# Patient Record
Sex: Male | Born: 1990 | Marital: Single | Smoking: Current every day smoker
Health system: Southern US, Community
[De-identification: ages and names within clinical notes are randomized; demographics above are authoritative.]

## PROBLEM LIST (undated history)

## (undated) HISTORY — PX: APPENDECTOMY: SHX54

---
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
## 2012-01-27 IMAGING — US US PELVIS LIMITED
1 series · 17 of 25 positions shown · non-contrast
Comparison: none

REASON FOR EXAM: testicular pain
COMMENTS:

[Series 1: us pelvis limited · 17 of 47 slices shown]
[im 1/47]
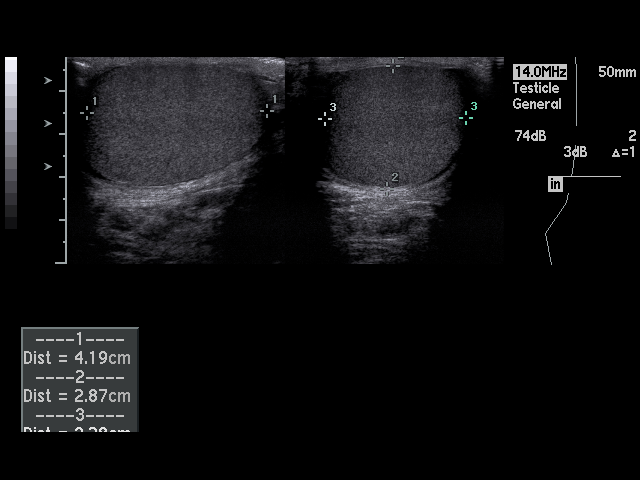
[im 4/47]
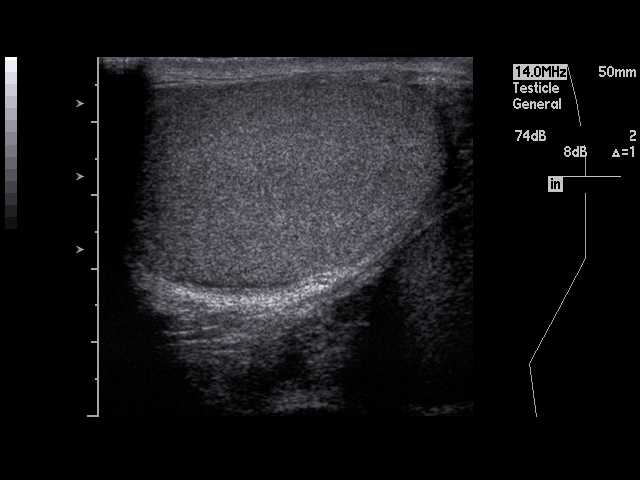
[im 6/47]
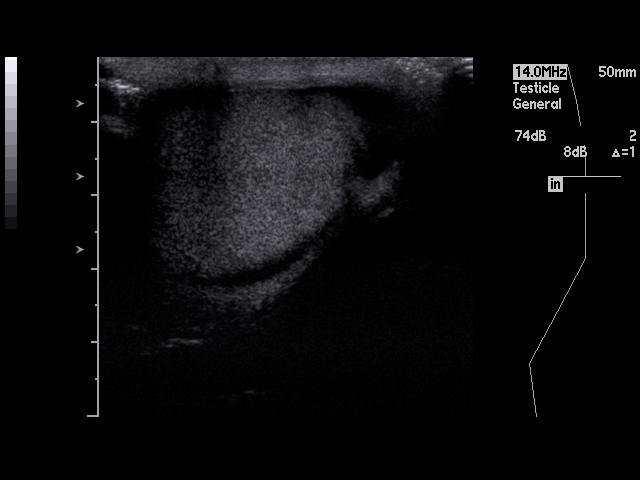
[im 10/47]
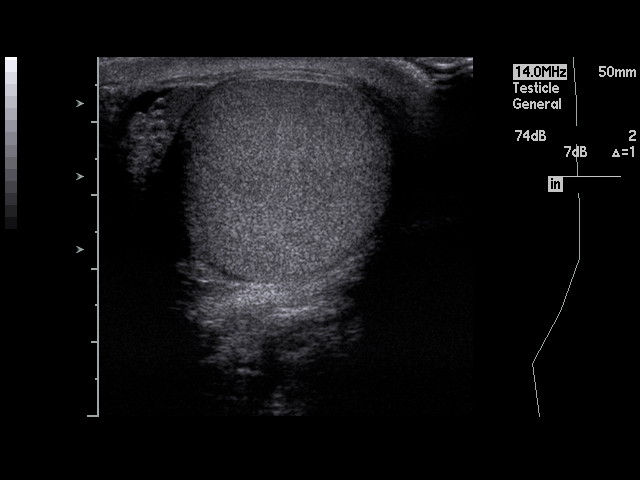
[im 12/47]
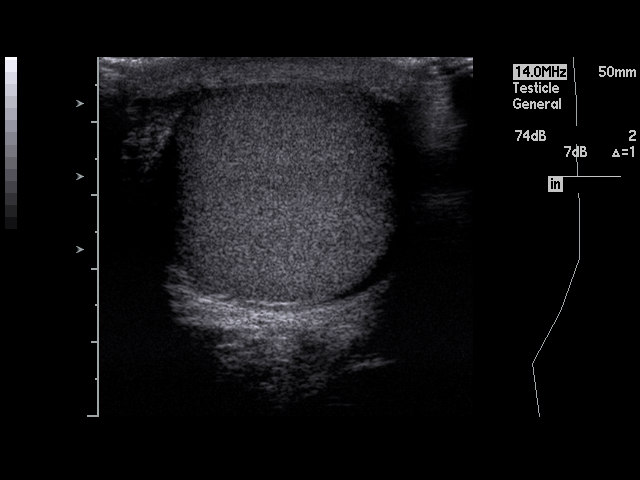
[im 16/47]
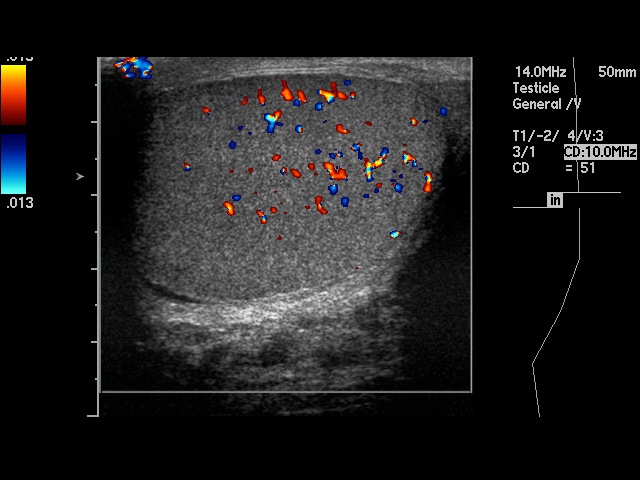
[im 18/47]
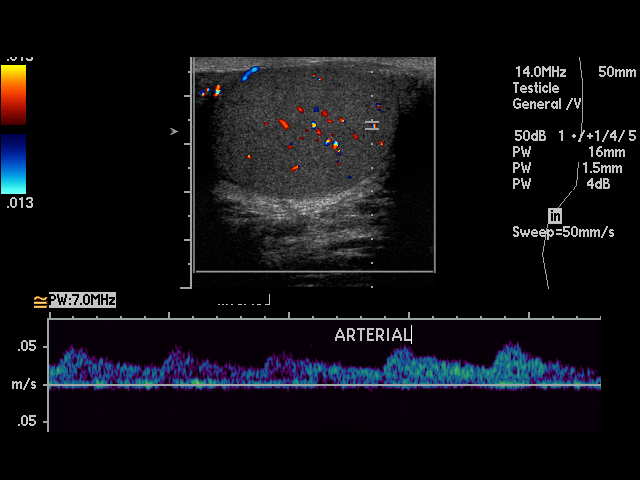
[im 22/47]
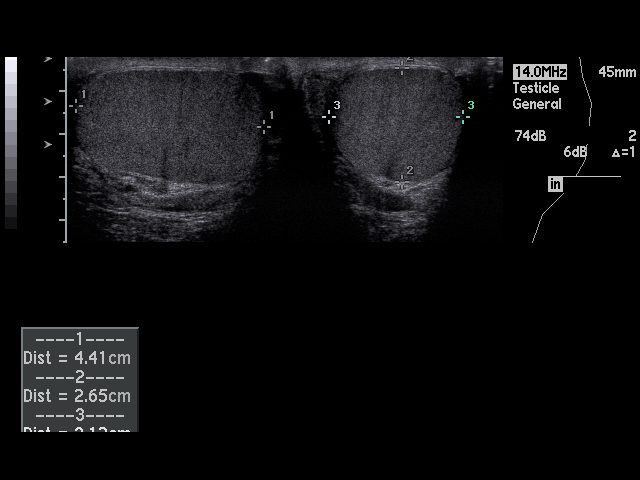
[im 24/47]
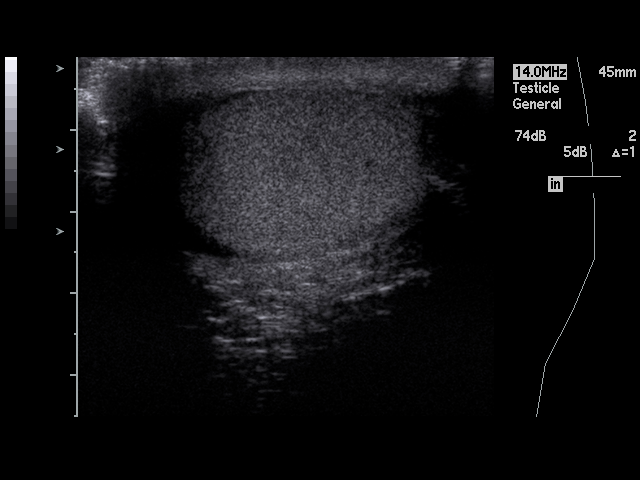
[im 25/47]
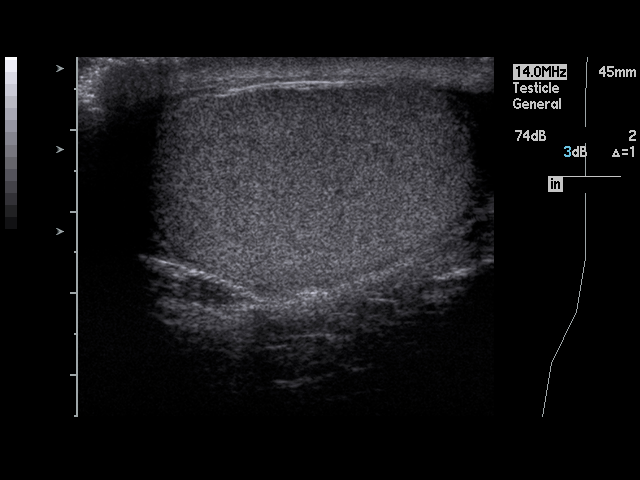
[im 29/47]
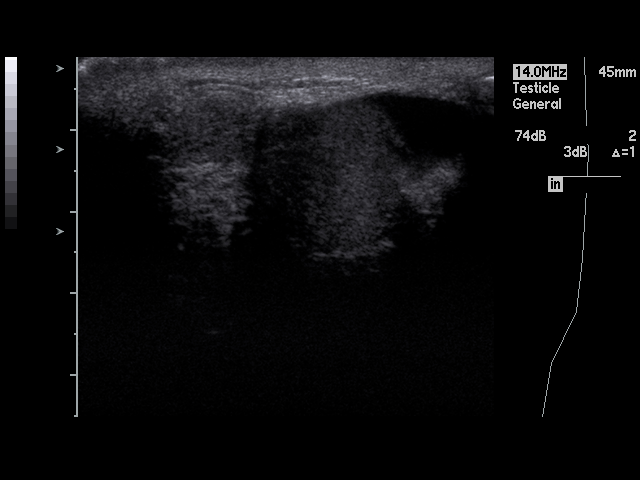
[im 31/47]
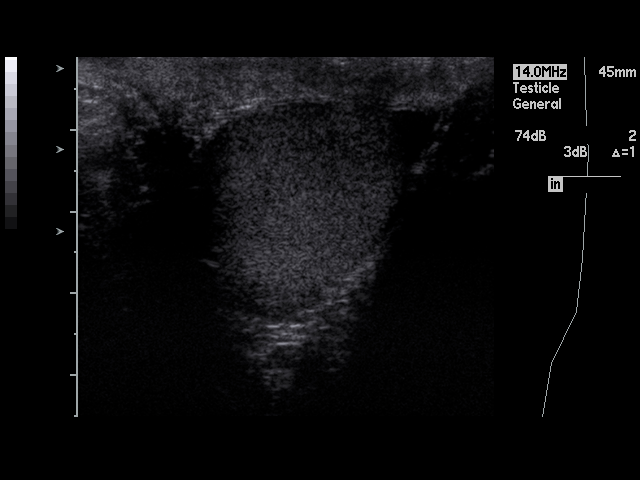
[im 35/47]
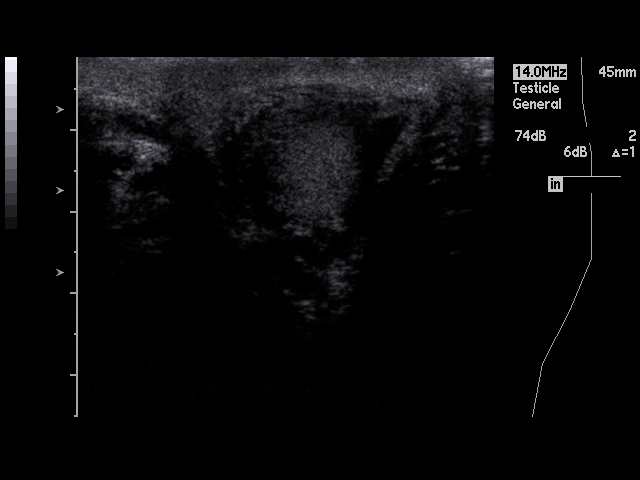
[im 37/47]
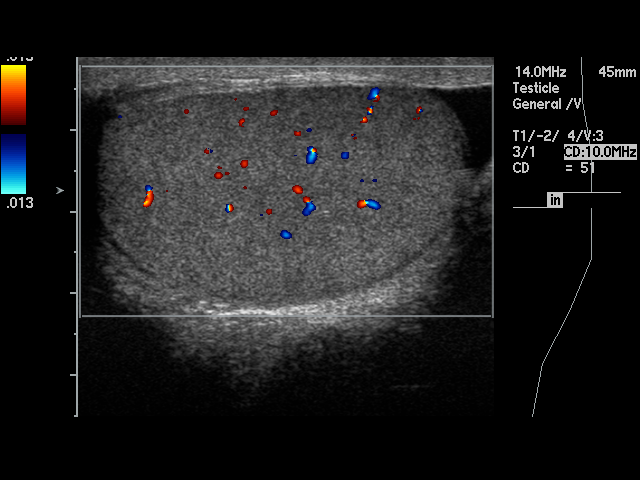
[im 41/47]
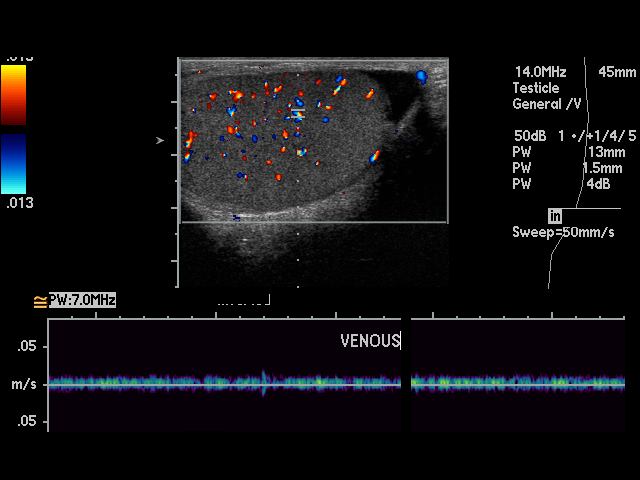
[im 43/47]
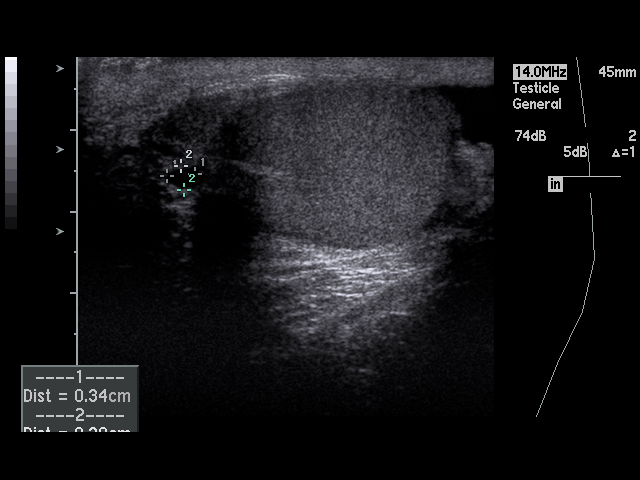
[im 47/47]
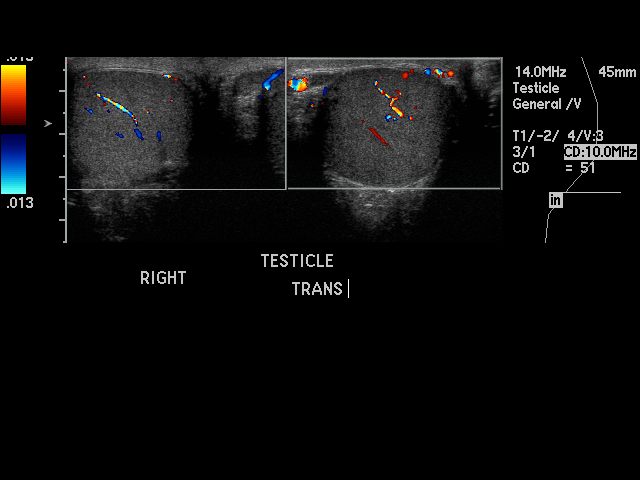

[17 of 25 positions shown; findings below may reference images not displayed]

PROCEDURE:     US  - US TESTICULAR  - March 23, 2011 [DATE]

RESULT:     The right testicle measures 4.2 cm x 2.9 cm x 3.3 cm and the
left testicle 4.4 cm x 2.7 cm x 3.1 cm. No intratesticular mass is seen.
Vascular flow is observed in each testicle on Doppler examination. No
torsion is evident. There is incidentally noted a 3.4 mm cyst of the head of
the left epididymis. No hypervascularity of the epididymis is seen on either
side. No scrotal wall thickening is seen.
IMPRESSION: No significant abnormalities are identified.

## 2019-01-03 ENCOUNTER — Telehealth: Payer: Self-pay | Admitting: Family Medicine

## 2019-01-03 NOTE — Telephone Encounter (Signed)
Still unable to reach pt by phone. 

## 2019-01-03 NOTE — Telephone Encounter (Signed)
Patient was a previous patient of yours in 2012. He would like to re establish care with you. He stated he would like to be seen as soon as possible to discuss his blood sugars.  Are you okay with patient re establishing with you ?

## 2019-01-03 NOTE — Telephone Encounter (Signed)
30 min OV when possible. Please triage in the meantime with ER cautions if he as severely elevated sugars.  Thanks.

## 2019-01-03 NOTE — Telephone Encounter (Signed)
Pt had message on phone that pt could not take the call now and to leave a v/m. I left v/m requesting pt to cb ASAP about BS. I will send message to [facility] CMA and myself.

## 2019-01-04 NOTE — Telephone Encounter (Signed)
Pt called back since did not get cb from yesterday; I advised pt I had tried to reach him back and pt informed me I did not have current phone #. I updated phone # and spoke with pt. Pt has never tested his BS; 2 ED drs told pt 3 - 4 years ago that he could have low BS. When pt has lightheadedness,H/A and feels like going to pass out if he drinks OJ or eats something he feels better. Last episode of these symptoms was 01/03/19 at 9:45 AM. Pt ate around 8:30 yesterday 2 sausage egg & cheese muffins. Lunch pt had brown rice, chicken, broccoli and diced potates. For supper a chicken and spinach salad. The symptoms have worsened over the last year. Pt has gained 70 lbs in last 1 1/2 years. Pt has not had any symptoms today.No covid symptoms except pt get SOB with anxiety when feels like going to pass out. No travel and no exposure to positive covid. Pt scheduled in office 30' reestablish care appt on 01/05/19 at 10:15. Pt advised could eat breakfast at 6 AM and be fasting or if pt feels like BS is dropping to go ahead and drink OJ or eat something. Ed precautions given. Pt voiced understanding and will update remainder of his demographics at check in. FYI to Dr Damita Dunnings.

## 2019-01-04 NOTE — Telephone Encounter (Signed)
Left v/m for pt to cb. 

## 2019-01-05 ENCOUNTER — Other Ambulatory Visit: Payer: Self-pay

## 2019-01-05 ENCOUNTER — Ambulatory Visit (INDEPENDENT_AMBULATORY_CARE_PROVIDER_SITE_OTHER): Payer: No Typology Code available for payment source | Admitting: Family Medicine

## 2019-01-05 ENCOUNTER — Encounter: Payer: Self-pay | Admitting: Family Medicine

## 2019-01-05 VITALS — BP 128/86 | HR 92 | Temp 97.1°F | Resp 18 | Ht 70.0 in | Wt 260.5 lb

## 2019-01-05 DIAGNOSIS — Z720 Tobacco use: Secondary | ICD-10-CM | POA: Diagnosis not present

## 2019-01-05 DIAGNOSIS — Z7189 Other specified counseling: Secondary | ICD-10-CM | POA: Diagnosis not present

## 2019-01-05 DIAGNOSIS — E162 Hypoglycemia, unspecified: Secondary | ICD-10-CM

## 2019-01-05 DIAGNOSIS — R635 Abnormal weight gain: Secondary | ICD-10-CM | POA: Diagnosis not present

## 2019-01-05 LAB — COMPREHENSIVE METABOLIC PANEL
ALT: 71 U/L — ABNORMAL HIGH (ref 0–53)
AST: 29 U/L (ref 0–37)
Albumin: 4.8 g/dL (ref 3.5–5.2)
Alkaline Phosphatase: 73 U/L (ref 39–117)
BUN: 17 mg/dL (ref 6–23)
CO2: 28 mEq/L (ref 19–32)
Calcium: 9.7 mg/dL (ref 8.4–10.5)
Chloride: 102 mEq/L (ref 96–112)
Creatinine, Ser: 0.83 mg/dL (ref 0.40–1.50)
GFR: 112 mL/min (ref >60.00)
Glucose, Bld: 96 mg/dL (ref 70–99)
Potassium: 4.3 mEq/L (ref 3.5–5.1)
Sodium: 138 mEq/L (ref 135–145)
Total Bilirubin: 0.5 mg/dL (ref 0.2–1.2)
Total Protein: 7.3 g/dL (ref 6.0–8.3)

## 2019-01-05 LAB — CBC WITH DIFFERENTIAL/PLATELET
Basophils Absolute: 0 10*3/uL (ref 0.0–0.1)
Basophils Relative: 0.5 % (ref 0.0–3.0)
Eosinophils Absolute: 0.2 10*3/uL (ref 0.0–0.7)
Eosinophils Relative: 2.7 % (ref 0.0–5.0)
HCT: 47.6 % (ref 39.0–52.0)
Hemoglobin: 15.7 g/dL (ref 13.0–17.0)
Lymphocytes Relative: 34.3 % (ref 12.0–46.0)
Lymphs Abs: 2.7 10*3/uL (ref 0.7–4.0)
MCHC: 33.1 g/dL (ref 30.0–36.0)
MCV: 87.7 fl (ref 78.0–100.0)
Monocytes Absolute: 0.8 10*3/uL (ref 0.1–1.0)
Monocytes Relative: 9.8 % (ref 3.0–12.0)
Neutro Abs: 4.2 10*3/uL (ref 1.4–7.7)
Neutrophils Relative %: 52.7 % (ref 43.0–77.0)
Platelets: 294 10*3/uL (ref 150.0–400.0)
RBC: 5.42 Mil/uL (ref 4.22–5.81)
RDW: 13.7 % (ref 11.5–15.5)
WBC: 8 10*3/uL (ref 4.0–10.5)

## 2019-01-05 LAB — HEMOGLOBIN A1C: Hgb A1c MFr Bld: 5.3 % (ref 4.6–6.5)

## 2019-01-05 LAB — TSH: TSH: 1.35 u[IU]/mL (ref 0.35–4.50)

## 2019-01-05 LAB — POCT CBG (FASTING - GLUCOSE)-MANUAL ENTRY: Glucose Fasting, POC: 88 mg/dL (ref 70–99)

## 2019-01-05 NOTE — Telephone Encounter (Signed)
We will see at office visit.  Thanks.

## 2019-01-05 NOTE — Progress Notes (Signed)
To reestablish care.  He is trying to stop smoking.  Smoking 1/2 ppd.  He is tapering down.  Gum use d/w pt.    Advance directive d/w pt.  He'll consider and update me.    Random episodes with feeling of presumed low sugar, going on for months to years, but more often recently.  Sx historically clearly better after taking a snack.   One episode of syncope years ago, also one episode of syncope with urination.  No episodes in years.    He has been trying to work on diet, with regular intake of 3 meals a day.  He is working on getting more healthy foods in the meantime.    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  He was prev at 185 lbs.    PMH and SH reviewed  ROS: Per HPI unless specifically indicated in ROS section   Meds, vitals, and allergies reviewed.   GEN: nad, alert and oriented HEENT: mucous membranes moist NECK: supple w/o LA CV: rrr.  no murmur PULM: ctab, no inc wob ABD: soft, +bs EXT: no edema SKIN: no acute rash  He felt a little lightheaded at the visit when sitting down, better with a snack.  Sugar 88 at the time.    Not lightheaded on standing on recheck.  See after visit summary. >30 minutes spent in face to face time with patient, >50% spent in counselling or coordination of care

## 2019-01-05 NOTE — Patient Instructions (Signed)
We'll contact you with your lab report.  Keep a snack nearby and avoid prolonged fasting.  Take care.  Glad to see you.

## 2019-01-07 ENCOUNTER — Other Ambulatory Visit: Payer: Self-pay | Admitting: Family Medicine

## 2019-01-07 ENCOUNTER — Encounter: Payer: Self-pay | Admitting: Family Medicine

## 2019-01-07 DIAGNOSIS — Z720 Tobacco use: Secondary | ICD-10-CM | POA: Insufficient documentation

## 2019-01-07 DIAGNOSIS — E162 Hypoglycemia, unspecified: Secondary | ICD-10-CM | POA: Insufficient documentation

## 2019-01-07 DIAGNOSIS — Z7189 Other specified counseling: Secondary | ICD-10-CM | POA: Insufficient documentation

## 2019-01-07 DIAGNOSIS — R7989 Other specified abnormal findings of blood chemistry: Secondary | ICD-10-CM

## 2019-01-07 DIAGNOSIS — R635 Abnormal weight gain: Secondary | ICD-10-CM | POA: Insufficient documentation

## 2019-01-07 NOTE — Assessment & Plan Note (Signed)
Presumed hypoglycemia or relative hypoglycemia.  Discussed with patient.  He had an episode at the office visit while sitting down.  He felt better with taking a snack.  He was not lightheaded on standing.  He does have weight gain noted.  See notes on labs.  He was not syncopal at the visit.

## 2019-01-07 NOTE — Assessment & Plan Note (Signed)
Discussed with patient.  He will consider whom he would want to speak for him in case he were incapacitated.

## 2019-01-07 NOTE — Assessment & Plan Note (Signed)
He is trying to stop smoking.  Smoking 1/2 ppd.  He is tapering down.  Gum use d/w pt. he will consider and update me as needed.

## 2019-01-07 NOTE — Assessment & Plan Note (Signed)
Noted with change in diet.  Discussed with patient.  See notes on labs.  Discussed diet and exercise.

## 2019-01-08 ENCOUNTER — Telehealth: Payer: Self-pay | Admitting: Family Medicine

## 2019-01-08 NOTE — Telephone Encounter (Signed)
Called patient and got him scheduled for RUQ [place] tomorrow at [place] . He needs a Dr's note clearing him to go back to work he said. Call patient at [phone] when and if this note can be done for him.

## 2019-01-08 NOTE — Telephone Encounter (Signed)
Note written and signed and left at front desk for pickup.  Patient advised.

## 2019-01-08 NOTE — Telephone Encounter (Signed)
Please give him a return note for work.  He should be able to return at this point.  Thanks.

## 2019-01-09 ENCOUNTER — Inpatient Hospital Stay: Admission: RE | Admit: 2019-01-09 | Payer: No Typology Code available for payment source | Source: Ambulatory Visit

## 2019-01-09 ENCOUNTER — Telehealth: Payer: Self-pay

## 2019-01-09 NOTE — Telephone Encounter (Signed)
Tried to call patient but no answer. Called [facility] and cancelled the [place].

## 2019-01-09 NOTE — Telephone Encounter (Signed)
Jonathan Kelly [facility] said pt does not need new order and she will contact pt. Thank you.

## 2019-01-09 NOTE — Telephone Encounter (Signed)
[facility] Night - Client Nonclinical Telephone Record AccessNurse Client [facility] Night - Client Client Site [facility] [place] Physician Renford Dills - MD Contact Type Call Who Is Calling Patient / Member / Family / Caregiver Caller Name [place] Phone Number [phone] Patient Name Jonathan Kelly Patient DOB January 23, 1991 Call Type Message Only Information Provided Reason for Call Request for General Office Information Initial Comment Caller has an appt for liver scan tomorrow at 7:40 am. He needs to change the time because he needs to fast and is hypoglycemic so he will need someone to take him and does not have a ride tomorrow. Additional Comment Call Closed By: Kathlynn Grate Transaction Date/Time: 01/08/2019 5:37:03 PM (ET)

## 2019-01-19 ENCOUNTER — Ambulatory Visit
Admission: RE | Admit: 2019-01-19 | Discharge: 2019-01-19 | Disposition: A | Discharge status: Home / Self Care | Payer: No Typology Code available for payment source | Source: Ambulatory Visit | Attending: Family Medicine | Admitting: Family Medicine

## 2019-01-19 DIAGNOSIS — R7989 Other specified abnormal findings of blood chemistry: Secondary | ICD-10-CM

## 2019-01-21 ENCOUNTER — Other Ambulatory Visit: Payer: Self-pay | Admitting: Family Medicine

## 2019-01-21 ENCOUNTER — Encounter: Payer: Self-pay | Admitting: Family Medicine

## 2019-01-21 DIAGNOSIS — K760 Fatty (change of) liver, not elsewhere classified: Secondary | ICD-10-CM | POA: Insufficient documentation

## 2019-01-23 ENCOUNTER — Telehealth: Payer: Self-pay | Admitting: Family Medicine

## 2019-01-23 NOTE — Telephone Encounter (Signed)
error 

## 2019-02-23 ENCOUNTER — Ambulatory Visit: Payer: Self-pay | Admitting: Family Medicine

## 2019-02-23 DIAGNOSIS — Z0289 Encounter for other administrative examinations: Secondary | ICD-10-CM

## 2019-04-09 ENCOUNTER — Telehealth: Payer: Self-pay

## 2019-04-09 NOTE — Telephone Encounter (Signed)
Pt said one wk ago pt started with pressure feeling in lower lt abd and lower back on lt side; no pain in back or abd. Also pt has swelling in both ankles that is worse on lt side; no redness or pain in ankles or lower legs. Pt sits a lot during the day and feet are hanging down. When pt puts feet up overnight the swelling does go down.No burning or pain or frequency of urine. No known injury.pt did have some weakness(not severe) in legs for 1-2 hours on and off on 04/08/19 but that is the first and last time of lower extremity weakness. For a few months on and off pt has SOB in mornings that only last few seconds. Pt attributed to smoking. Pt has SOB while sitting does not have to have exertion to have SOB. No CP,h/a,dizziness no arm or upper extremity weakness and no vision changes.pt had diarrhea x 1 last wk after eating barbeque pizza and pt attributed the diarrhea due to what he ate.No chills,fever, cough,ST,muscle pain or loss of taste or smell. T 98. Pt sweats a lot randomly and easily for 6 months. Pt reestablished care 01/05/19. Pt scheduled virtual visit on 04/10/19 at 8:15 and will have wt and temp ready for call from [place]. ED & UC precautions given and pt voiced understanding. FYI to Dr Damita Dunnings.

## 2019-04-10 ENCOUNTER — Other Ambulatory Visit: Payer: Self-pay

## 2019-04-10 ENCOUNTER — Encounter: Payer: Self-pay | Admitting: Family Medicine

## 2019-04-10 ENCOUNTER — Ambulatory Visit (INDEPENDENT_AMBULATORY_CARE_PROVIDER_SITE_OTHER): Payer: Self-pay | Admitting: Family Medicine

## 2019-04-10 ENCOUNTER — Ambulatory Visit (INDEPENDENT_AMBULATORY_CARE_PROVIDER_SITE_OTHER)
Admission: RE | Admit: 2019-04-10 | Discharge: 2019-04-10 | Disposition: A | Discharge status: Home / Self Care | Payer: Self-pay | Source: Ambulatory Visit | Attending: Family Medicine | Admitting: Family Medicine

## 2019-04-10 ENCOUNTER — Other Ambulatory Visit (INDEPENDENT_AMBULATORY_CARE_PROVIDER_SITE_OTHER): Payer: Self-pay

## 2019-04-10 DIAGNOSIS — K760 Fatty (change of) liver, not elsewhere classified: Secondary | ICD-10-CM

## 2019-04-10 DIAGNOSIS — R0602 Shortness of breath: Secondary | ICD-10-CM

## 2019-04-10 DIAGNOSIS — R945 Abnormal results of liver function studies: Secondary | ICD-10-CM

## 2019-04-10 DIAGNOSIS — R7989 Other specified abnormal findings of blood chemistry: Secondary | ICD-10-CM

## 2019-04-10 LAB — COMPREHENSIVE METABOLIC PANEL
ALT: 74 U/L — ABNORMAL HIGH (ref 0–53)
AST: 26 U/L (ref 0–37)
Albumin: 4.6 g/dL (ref 3.5–5.2)
Alkaline Phosphatase: 81 U/L (ref 39–117)
BUN: 10 mg/dL (ref 6–23)
CO2: 27 mEq/L (ref 19–32)
Calcium: 9.8 mg/dL (ref 8.4–10.5)
Chloride: 103 mEq/L (ref 96–112)
Creatinine, Ser: 0.89 mg/dL (ref 0.40–1.50)
GFR: 103.12 mL/min (ref >60.00)
Glucose, Bld: 113 mg/dL — ABNORMAL HIGH (ref 70–99)
Potassium: 3.8 mEq/L (ref 3.5–5.1)
Sodium: 139 mEq/L (ref 135–145)
Total Bilirubin: 0.5 mg/dL (ref 0.2–1.2)
Total Protein: 7.1 g/dL (ref 6.0–8.3)

## 2019-04-10 LAB — HEPATIC FUNCTION PANEL
ALT: 74 U/L — ABNORMAL HIGH (ref 0–53)
AST: 26 U/L (ref 0–37)
Albumin: 4.6 g/dL (ref 3.5–5.2)
Alkaline Phosphatase: 81 U/L (ref 39–117)
Bilirubin, Direct: 0.1 mg/dL (ref 0.0–0.3)
Total Bilirubin: 0.5 mg/dL (ref 0.2–1.2)
Total Protein: 7.1 g/dL (ref 6.0–8.3)

## 2019-04-10 LAB — BRAIN NATRIURETIC PEPTIDE: Pro B Natriuretic peptide (BNP): 15 pg/mL (ref 0.0–100.0)

## 2019-04-10 LAB — CBC WITH DIFFERENTIAL/PLATELET
Basophils Absolute: 0 10*3/uL (ref 0.0–0.1)
Basophils Relative: 0.5 % (ref 0.0–3.0)
Eosinophils Absolute: 0.3 10*3/uL (ref 0.0–0.7)
Eosinophils Relative: 3.7 % (ref 0.0–5.0)
HCT: 45 % (ref 39.0–52.0)
Hemoglobin: 15.2 g/dL (ref 13.0–17.0)
Lymphocytes Relative: 35.2 % (ref 12.0–46.0)
Lymphs Abs: 3.2 10*3/uL (ref 0.7–4.0)
MCHC: 33.8 g/dL (ref 30.0–36.0)
MCV: 86.9 fl (ref 78.0–100.0)
Monocytes Absolute: 0.8 10*3/uL (ref 0.1–1.0)
Monocytes Relative: 8.7 % (ref 3.0–12.0)
Neutro Abs: 4.7 10*3/uL (ref 1.4–7.7)
Neutrophils Relative %: 51.9 % (ref 43.0–77.0)
Platelets: 292 10*3/uL (ref 150.0–400.0)
RBC: 5.17 Mil/uL (ref 4.22–5.81)
RDW: 13.7 % (ref 11.5–15.5)
WBC: 9.1 10*3/uL (ref 4.0–10.5)

## 2019-04-10 NOTE — Progress Notes (Signed)
Interactive audio and video telecommunications were attempted between this provider and patient, however failed, due to patient having technical difficulties OR patient did not have access to video capability.  We continued and completed visit with audio only.   Virtual Visit via Telephone Note  I connected with patient on 04/10/19  at 8:31 AM  by telephone and verified that I am speaking with the correct person using two identifiers.  Location of patient: home.   Location of MD: [facility] Name of referring provider (if blank then none associated): Names per persons and role in encounter:  MD: Earlyne Iba, Patient: name listed above.    I discussed the limitations, risks, security and privacy concerns of performing an evaluation and management service by telephone and the availability of in person appointments. I also discussed with the patient that there may be a patient responsible charge related to this service. The patient expressed understanding and agreed to proceed.  CC: edema.   History of Present Illness:   Sweating episodically. More in the last ~5 months.   "I've always sweated easily."  Some BLE edema may get some better with elevation.  This has been going on for about 1 week.   Some lower abd pressure, intermittent. Not painful but uncomfortable.  Noted recently.    No fevers, no vomiting.  GI upset after pizza recently, this is better in the meantime.  No blood in stool.  Normal stools o/w.  No burning with urination.    Prev u/s with diffuse increase in liver echogenicity, a finding indicative of hepatic steatosis. No focal liver lesions are evident. Prev LFT elevation noted.   Weight is up slightly in the meantime, 276 lbs today, up from 260 lbs in 12/2018.  Minimal SOB.  Smoker, trying to quit.  No wheeze.    Observations/Objective: nad Speech wnl   Assessment and Plan: He has episodic sweating along with some increase in bilateral lower extremity edema and  some lower abdominal pressure that is intermittent.  Unclear significance of those symptoms.  He has a history of known fatty liver disease and mild LFT elevation.  At this point reasonable to check follow-up labs, see orders.  Check chest x-ray.  Patient will come in for those studies and we will go from there.  At this point still okay for outpatient follow-up.  He agrees.  Follow Up Instructions: See above  I discussed the assessment and treatment plan with the patient. The patient was provided an opportunity to ask questions and all were answered. The patient agreed with the plan and demonstrated an understanding of the instructions.   The patient was advised to call back or seek an in-person evaluation if the symptoms worsen or if the condition fails to improve as anticipated.  I provided 15 minutes of non-face-to-face time during this encounter.  Elsie Stain, MD

## 2019-04-10 NOTE — Telephone Encounter (Signed)
Noted. Thanks.

## 2019-04-11 ENCOUNTER — Encounter: Payer: Self-pay | Admitting: Family Medicine

## 2019-04-11 NOTE — Assessment & Plan Note (Signed)
He has episodic sweating along with some increase in bilateral lower extremity edema and some lower abdominal pressure that is intermittent.  Unclear significance of those symptoms.  He has a history of known fatty liver disease and mild LFT elevation.  At this point reasonable to check follow-up labs, see orders.  Check chest x-ray.  Patient will come in for those studies and we will go from there.  At this point still okay for outpatient follow-up.  He agrees.

## 2019-04-17 ENCOUNTER — Ambulatory Visit (INDEPENDENT_AMBULATORY_CARE_PROVIDER_SITE_OTHER): Payer: Self-pay | Admitting: Family Medicine

## 2019-04-17 ENCOUNTER — Encounter: Payer: Self-pay | Admitting: Family Medicine

## 2019-04-17 ENCOUNTER — Other Ambulatory Visit: Payer: Self-pay

## 2019-04-17 VITALS — BP 142/102 | HR 114 | Temp 97.0°F | Ht 70.0 in | Wt 280.5 lb

## 2019-04-17 DIAGNOSIS — B079 Viral wart, unspecified: Secondary | ICD-10-CM

## 2019-04-17 DIAGNOSIS — B360 Pityriasis versicolor: Secondary | ICD-10-CM

## 2019-04-17 DIAGNOSIS — K760 Fatty (change of) liver, not elsewhere classified: Secondary | ICD-10-CM

## 2019-04-17 DIAGNOSIS — R7989 Other specified abnormal findings of blood chemistry: Secondary | ICD-10-CM

## 2019-04-17 MED ORDER — SELENIUM SULFIDE 2.5 % EX LOTN: 1.0000 "application " | TOPICAL_LOTION | Freq: Every day | CUTANEOUS | 12 refills | Status: DC | PRN

## 2019-04-17 NOTE — Patient Instructions (Signed)
Cover the warts as needed.   Should blister then heal.   Use selenium as needed.   Go to the lab on the way out.  We'll contact you with your lab report. Take care.  Glad to see you.

## 2019-04-17 NOTE — Progress Notes (Signed)
BLE edema.  Some better in the last few days.  Drinking more water in the last few days.  Noted less edema in the morning after sleeping overnight.  He cut out some sugary foods in the meantime.   History of abd pressure.  Mild, LLQ.  Some better in the last few days.  No FCNAVD  Fatty liver changes d/w pt.    Episodic sweats.  Better in the last few days, for unclear reason.    He was checked for HBV and HCV with red cross donation, about 1.5 years ago.  This was before his most recent LFT elevation was noted. No high risk behavior.    Elevated pulse noted on intake but recheck pulse 96.  He is applying for jobs now.  Discussed  Meds, vitals, and allergies reviewed.   ROS: Per HPI unless specifically indicated in ROS section   nad ncat Neck supple, no LA rrr ctab No edema abd not ttp.  No rebound.  Normal bowel sounds Skin well perfused. Tinea versicolor noted on the back. He has a wart in each of the antecubital fossas bilaterally, he wanted treatment.

## 2019-04-18 LAB — HEPATITIS PANEL, ACUTE
Hep A IgM: NONREACTIVE
Hep B C IgM: NONREACTIVE
Hepatitis B Surface Ag: NONREACTIVE
Hepatitis C Ab: NONREACTIVE
SIGNAL TO CUT-OFF: 0.01 (ref <1.00)

## 2019-04-19 ENCOUNTER — Telehealth: Payer: Self-pay

## 2019-04-19 DIAGNOSIS — B079 Viral wart, unspecified: Secondary | ICD-10-CM | POA: Insufficient documentation

## 2019-04-19 DIAGNOSIS — B360 Pityriasis versicolor: Secondary | ICD-10-CM | POA: Insufficient documentation

## 2019-04-19 NOTE — Assessment & Plan Note (Signed)
This all could be from fatty liver changes.  His abdominal pressure is better than last few days.  His episodic sweating is better.  He does not have any edema now and he has a benign abdominal exam.  Reasonable to check acute hepatitis panel but otherwise continue work on diet and exercise and weight reduction.  Avoid sugary foods given the fatty liver changes.  Discussed.  See notes on labs.

## 2019-04-19 NOTE — Assessment & Plan Note (Signed)
1 in the antecubital fossa on each elbow bilaterally.  Frozen x3 with liquid nitrogen without complication after getting verbal informed consent.  Routine post nitrogen instructions given to patient and understood.  No treatment is 100% effective with one course but he may get good results with treatment today.

## 2019-04-19 NOTE — Assessment & Plan Note (Signed)
See LFT elevation discussion.

## 2019-04-19 NOTE — Telephone Encounter (Signed)
Spoke with pt in regards to lab results and pt has noticed that the swelling went down in his ankles.  Pt said that he is having some sugar withdraws as he notices a difference when he intakes sugar, which has been discussed during last visit.  Pt also explains that he sweats a lot more easily then usually.  I informed pt that I would let Dr. Damita Dunnings know.

## 2019-04-19 NOTE — Assessment & Plan Note (Signed)
Can use selenium sulfide and update me as needed.  Discussed.

## 2019-04-20 NOTE — Telephone Encounter (Signed)
I called patient & he stated he was feeling better w/o swelling. He is still watching his sugar intake & will continue this. He will let us know if he needs anything.

## 2019-04-20 NOTE — Telephone Encounter (Signed)
I would work on consistently decreasing his sugar intake and see if his symptoms improve.  I think this the best option at this point.  If is not getting any better then please let me know.  Thanks.

## 2019-06-22 ENCOUNTER — Telehealth: Payer: Self-pay

## 2019-06-22 ENCOUNTER — Other Ambulatory Visit: Payer: Self-pay

## 2019-06-22 ENCOUNTER — Ambulatory Visit (INDEPENDENT_AMBULATORY_CARE_PROVIDER_SITE_OTHER): Payer: Self-pay | Admitting: Family Medicine

## 2019-06-22 ENCOUNTER — Encounter: Payer: Self-pay | Admitting: Family Medicine

## 2019-06-22 DIAGNOSIS — B360 Pityriasis versicolor: Secondary | ICD-10-CM

## 2019-06-22 DIAGNOSIS — R109 Unspecified abdominal pain: Secondary | ICD-10-CM

## 2019-06-22 DIAGNOSIS — Z720 Tobacco use: Secondary | ICD-10-CM

## 2019-06-22 MED ORDER — SELENIUM SULFIDE 2.5 % EX LOTN: 1.0000 "application " | TOPICAL_LOTION | Freq: Every day | CUTANEOUS | 12 refills | Status: DC | PRN

## 2019-06-22 MED ORDER — FAMOTIDINE 20 MG PO TABS: 20.0000 mg | ORAL_TABLET | Freq: Two times a day (BID) | ORAL | 0 refills | Status: DC

## 2019-06-22 NOTE — Progress Notes (Signed)
This visit occurred during the SARS-CoV-2 public health emergency.  Safety protocols were in place, including screening questions prior to the visit, additional usage of staff PPE, and extensive cleaning of exam room while observing appropriate contact time as indicated for disinfecting solutions.  1PPD smoking, wants to quit.  Discussed options.  Nicotine gum may be entirely reasonable.  Discussed use.  Needed refill on selenium sulfide, used with relief of rash.  Nearly resolved.    LUQ abdominal pressure, intermittent.  Noted in the last few days.  He had felt better prior to the last few days, when he cut out sugar.  His anxiety had been better.  Ankle swelling resolved with low carb diet. No fevers. No vomiting.  No diarrhea.  No blood in stool.  No R sided abd pain.  No LLQ abd pain.  Not taking ibuprofen or aleve.  Some occ heartburn, some last night.  He had some acidic regurgitation.  Prev took prilosec in the distant past.  Unclear if TUMS helped recently.    Meds, vitals, and allergies reviewed.   ROS: Per HPI unless specifically indicated in ROS section   nad ncat Neck supple, no LA Mildly tachy but regular.  ctab Benign appearing tinea versicolor noted on the lower back. Abdomen soft, not tender.  Normal bowel sounds.  Abdominal wall not tender on testing with sit ups/rotated crunches.  No mass noted.

## 2019-06-22 NOTE — Telephone Encounter (Signed)
Please run this by triage, sounds like in office would be reasonable. Thanks.   Brigitte Pulse    .    Message text   Kerin Salen, MD 1 hour ago (10:53 AM)  [facility] Good morning,   Would you like him to be scheduled in office or virtually?   Thanks,  Raquel Sarna   Message text   Jonathan Kelly  Patient Appointment Schedule Request Pool 18 hours ago (5:25 PM)  CB Appointment Request From: Jonathan Kelly  With Provider: Elsie Stain, MD [facility] [place]  Preferred Date Range: 06/25/2019 - 06/26/2019  Preferred Times: Any Time  Reason for visit: Request an Appointment  Comments: I have been feeling great for awhile now, still haven't lost the weird but I'm suddenly having pressure in the left side of my stomach again for the past few days but it's in a different spot than before. it's more the upper left side of my stomach.

## 2019-06-22 NOTE — Patient Instructions (Signed)
Check your pulse at rest at home and update me if persistently >100. Try taking pepcid daily and let me know if that doesn't help.  Try flavored nicotine gum, see if that works for you.  Update me as needed.  Take care.  Glad to see you.

## 2019-06-22 NOTE — Telephone Encounter (Signed)
I spoke with pt and for 4 days pt has had lt upper abd pressure; no pain; Pt has no covid symptoms, no travel and no known exposure to + covid. UC & ED precautions given and pt voiced understanding. Pt scheduled in office appt with Dr Damita Dunnings today at [place].

## 2019-06-22 NOTE — Telephone Encounter (Signed)
Noted. Thanks.

## 2019-06-24 DIAGNOSIS — R109 Unspecified abdominal pain: Secondary | ICD-10-CM | POA: Insufficient documentation

## 2019-06-24 NOTE — Assessment & Plan Note (Signed)
Continue topical selenium.  Update me as needed.

## 2019-06-24 NOTE — Assessment & Plan Note (Signed)
He can check his pulse at rest at home and update me if persistently >100.  I think this is a separate issue and may resolve once he is out of the medical clinic.  Abdominal pressure could be due to GERD.  He is noted some heartburn.  He can try taking pepcid daily and let me know if that doesn't help.  Benign abdominal exam.  Okay for outpatient follow-up.  He agrees with plan.

## 2019-06-24 NOTE — Assessment & Plan Note (Signed)
He can try flavored nicotine gum, update me as needed.

## 2019-09-16 ENCOUNTER — Encounter: Payer: Self-pay | Admitting: Family Medicine

## 2019-10-05 ENCOUNTER — Encounter: Payer: Self-pay | Admitting: Family Medicine

## 2019-10-05 NOTE — Telephone Encounter (Signed)
Received notification that patient has not reviewed his message on mychart from Dr Para March. Left message for patient to call back.

## 2019-10-05 NOTE — Telephone Encounter (Signed)
Patient read the note, see other mychart message

## 2020-01-22 ENCOUNTER — Telehealth: Payer: Self-pay | Admitting: Family Medicine

## 2020-01-22 NOTE — Telephone Encounter (Signed)
Left message asking pt to call office  Appointment Request From: Kandice Hams  With Provider: Crawford Givens, MD [facility] [place]]  Preferred Date Range: 01/21/2020 - 01/29/2020  Preferred Times: Any Time  Reason for visit: Request an Appointment  Comments: Over the past few days i believe i may have some swelling in my upper left abdomen. No pain but a slight pressure. Basically where my spleen would be. Besides that i have been great no issues until the past few days.

## 2020-01-24 ENCOUNTER — Encounter: Payer: Self-pay | Admitting: Family Medicine

## 2020-01-24 ENCOUNTER — Other Ambulatory Visit: Payer: Self-pay

## 2020-01-24 ENCOUNTER — Ambulatory Visit (INDEPENDENT_AMBULATORY_CARE_PROVIDER_SITE_OTHER): Payer: Self-pay | Admitting: Family Medicine

## 2020-01-24 VITALS — BP 124/70 | HR 84 | Temp 98.2°F | Ht 70.0 in | Wt 272.0 lb

## 2020-01-24 DIAGNOSIS — R109 Unspecified abdominal pain: Secondary | ICD-10-CM

## 2020-01-24 DIAGNOSIS — B360 Pityriasis versicolor: Secondary | ICD-10-CM

## 2020-01-24 DIAGNOSIS — B079 Viral wart, unspecified: Secondary | ICD-10-CM

## 2020-01-24 MED ORDER — FAMOTIDINE 20 MG PO TABS: 20.0000 mg | ORAL_TABLET | Freq: Two times a day (BID) | ORAL | Status: DC

## 2020-01-24 MED ORDER — SELENIUM SULFIDE 2.5 % EX LOTN: 1.0000 "application " | TOPICAL_LOTION | Freq: Every day | CUTANEOUS | 12 refills | Status: DC | PRN

## 2020-01-24 NOTE — Patient Instructions (Signed)
Keep working on quitting smoking.    Use selenium as needed.    Possible GERD.  Would try taking pepcid and with TUMS as needed.  Update me as needed.   Take care.  Glad to see you.  Thanks for your effort.

## 2020-01-24 NOTE — Progress Notes (Signed)
This visit occurred during the SARS-CoV-2 public health emergency.  Safety protocols were in place, including screening questions prior to the visit, additional usage of staff PPE, and extensive cleaning of exam room while observing appropriate contact time as indicated for disinfecting solutions.  His father is in the ER, d/w pt.  Condolences offered.    He is trying to quit smoking, encouraged.  He tried nicotine gum but didn't tolerate that.  He is smoking ~1/2 PPD.    LUQ pain.  Going on for about 1 week.  Intermittent pressure but not now.  Noted with rolling over in bed.  Not severe pain.  No FCNAVD.  No rash locally.  No bloody or black stools.  No aleve, no ibuprofen use.  Some occ heartburn, depending on diet.  No dysuria.  No hematuria.   He had prev used selenium with relief but sx returned after the fact, on his back.    He was using acid plasters for warts on his skin.    He cut a lot of carbs out of his diet.  Intentional weight loss and anxiety is better.    Meds, vitals, and allergies reviewed.   ROS: Per HPI unless specifically indicated in ROS section   GEN: nad, alert and oriented HEENT: ncat NECK: supple w/o LA CV: rrr. PULM: ctab, no inc wob ABD: soft, +bs, not ttp x4 quadrants.   No pain with twisting.   EXT: no edema SKIN: tinea noted on the back.  Small amount of residual warty tissue on the L antecubital area.

## 2020-01-24 NOTE — Assessment & Plan Note (Signed)
Reasonable to continue with OTC topical tx.

## 2020-01-24 NOTE — Assessment & Plan Note (Signed)
Restart selenium topically.  Update me as needed.

## 2020-01-24 NOTE — Assessment & Plan Note (Signed)
Possible GERD.  Would try taking pepcid (has not been on med recently) and with TUMS as needed.  Update me as needed.  Benign abd exam.  D/w pt.  He agrees.

## 2020-02-14 IMAGING — DX DG CHEST 2V
2 series · 2 of 2 positions shown · non-contrast
Comparison: None.

CLINICAL DATA: Shortness of breath

EXAM:
CHEST - 2 VIEW

[chest pa]
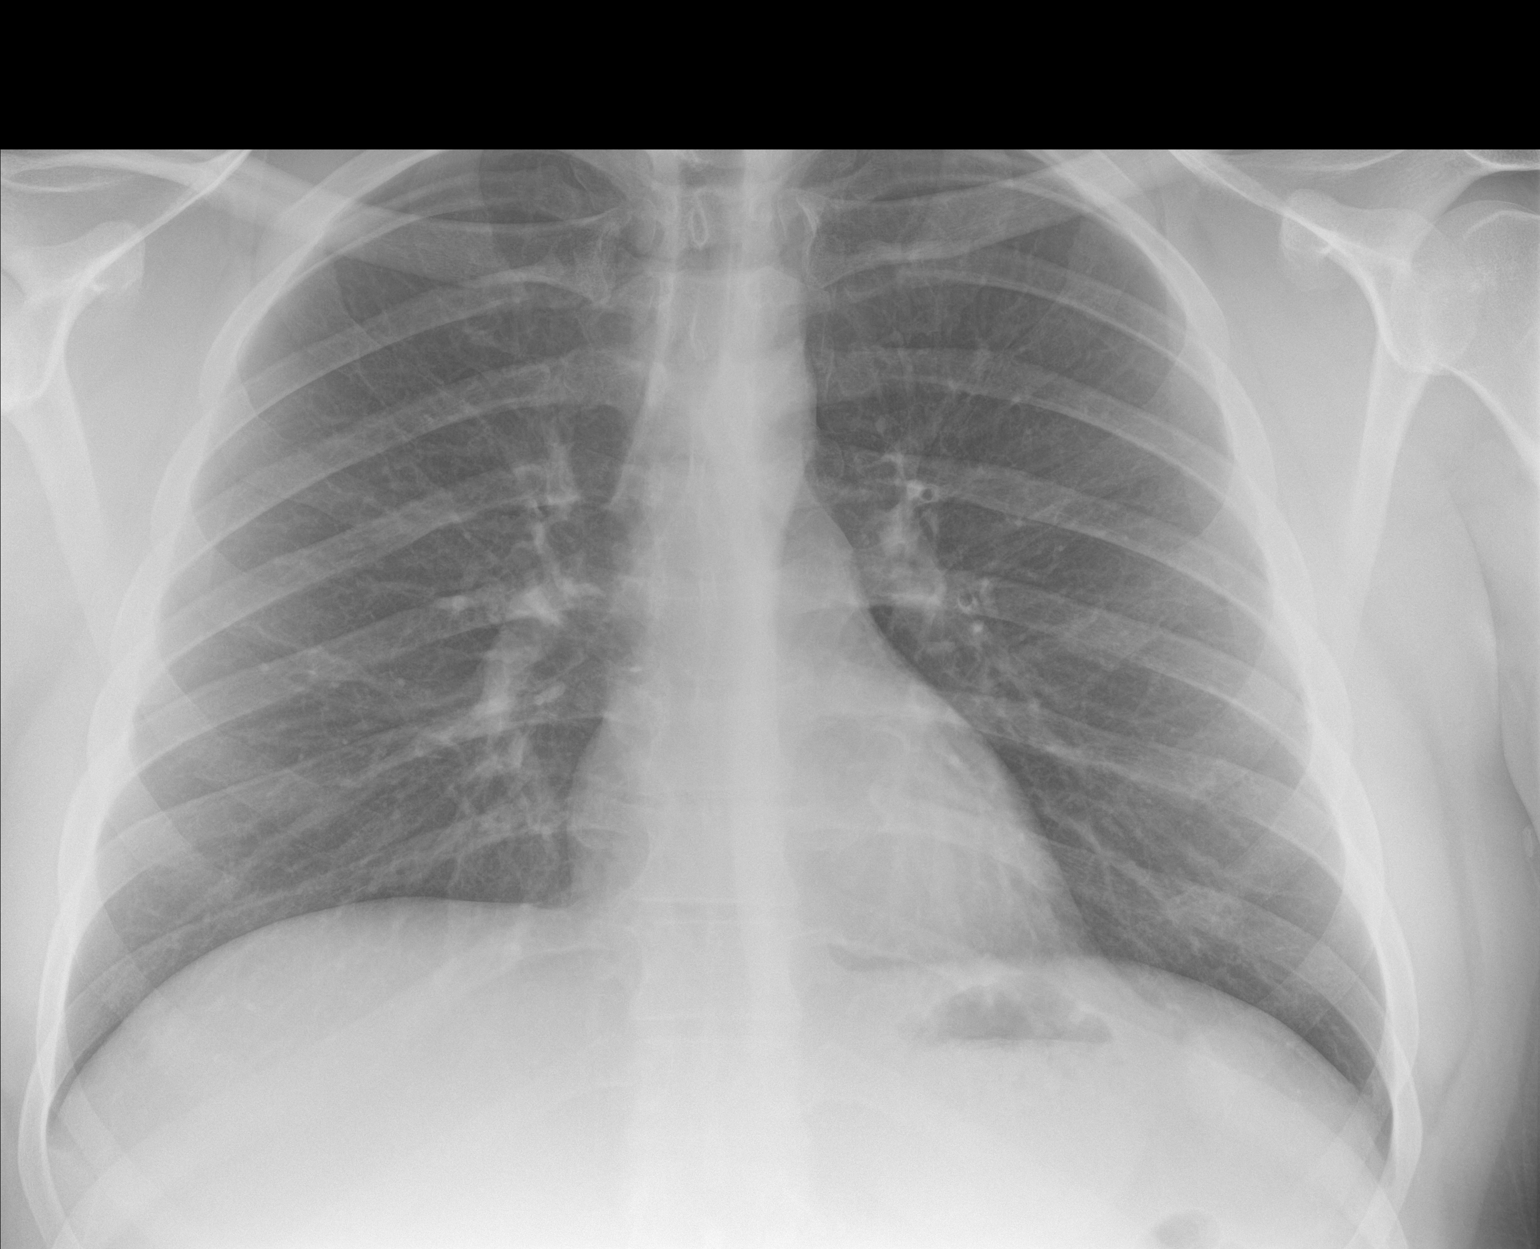

[chest lat]
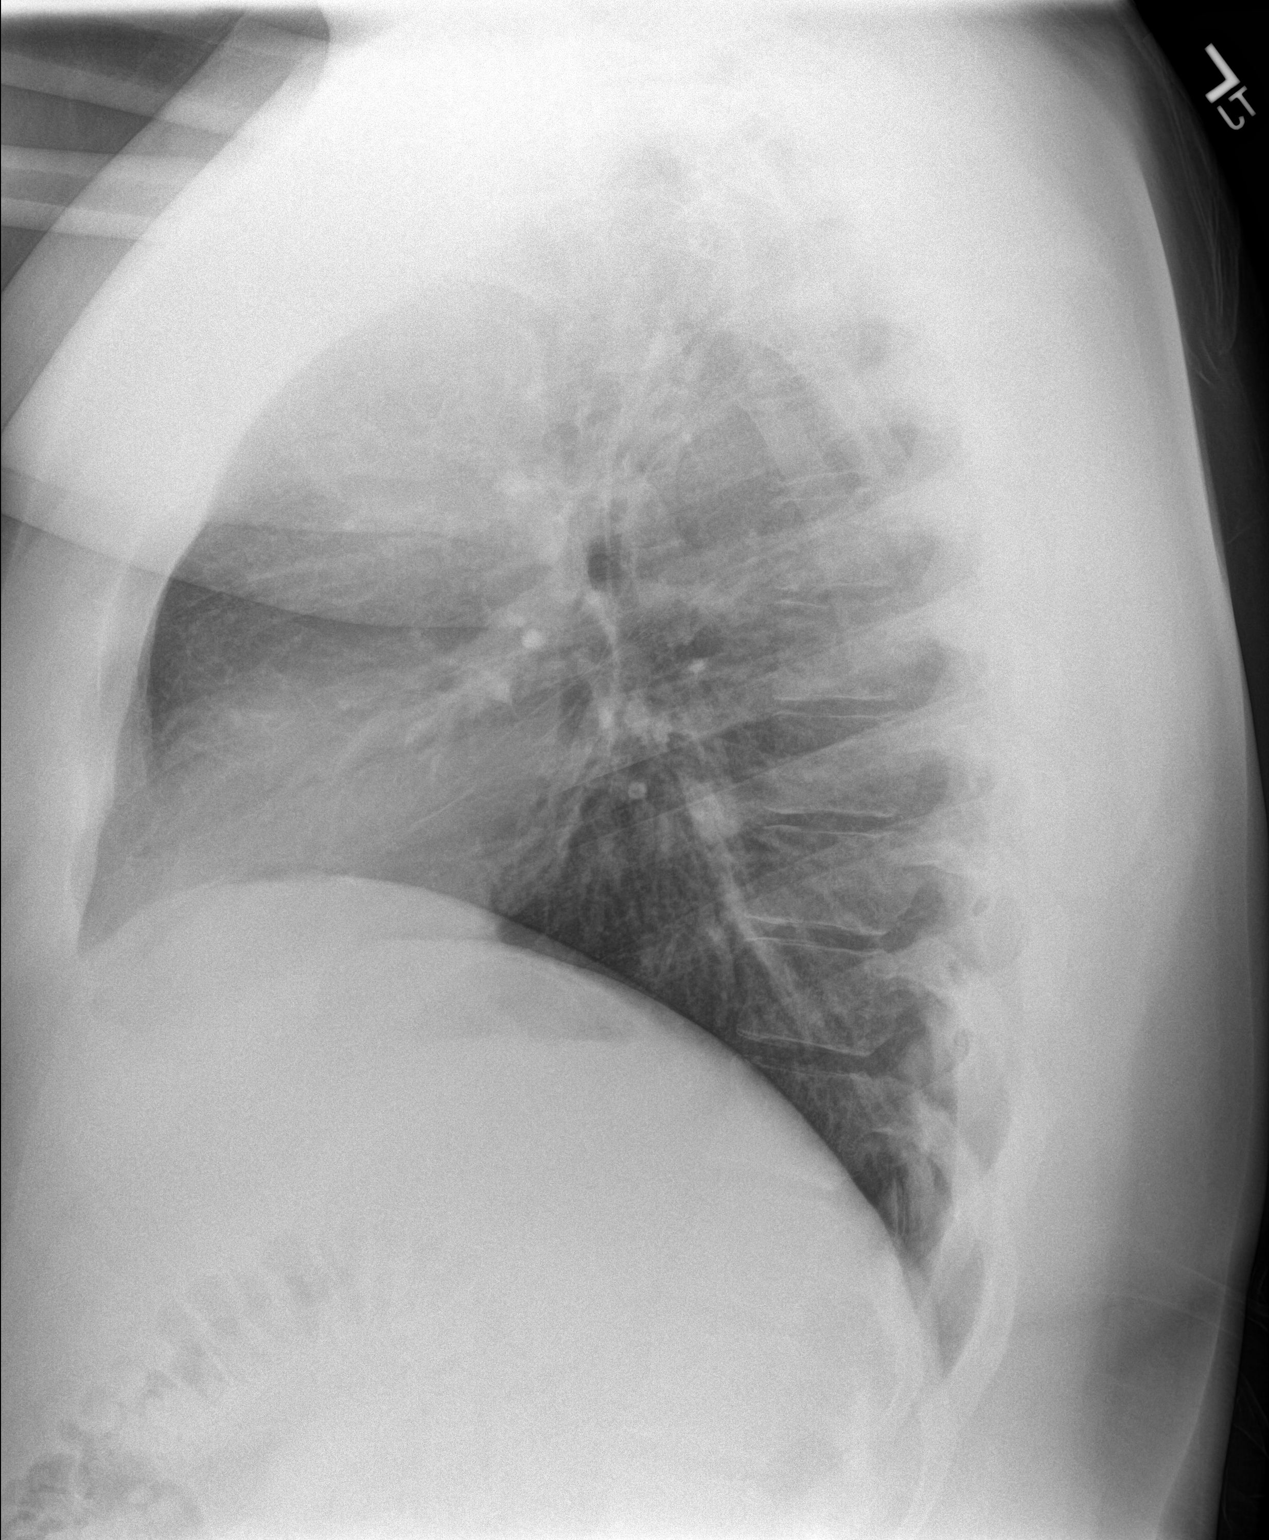

[2 of 2 positions shown; findings below may reference images not displayed]

FINDINGS: The heart size and mediastinal contours are within normal limits.
Both lungs are clear. The visualized skeletal structures are
unremarkable.
IMPRESSION: No acute abnormality of the lungs.

## 2020-03-17 ENCOUNTER — Encounter: Payer: Self-pay | Admitting: Family Medicine

## 2020-04-11 ENCOUNTER — Telehealth: Payer: Self-pay | Admitting: *Deleted

## 2020-04-11 NOTE — Telephone Encounter (Signed)
Patient called stating that his big toe may be infected. Patient stated he noticed this morning his big toe is red and a little swollen. Patient stated that he thinks that he may have an ingrown toenail and he has tried to clip it out. Patient real  denies drainage, fever, toe is not warm to the touch and no streaks. Patient stated that the toe is not very  painful but is sore if he presses on it. Patient scheduled to see Deboraha Sprang NP Monday 04/14/20 at 2:00 pm. Patient was advised that Eunice Blase does not remove ingrown toenails but can treat an infected toe. Patient was advised to do warm soaks 4-6 times a day and can do warm Epson salt soaks. Patient was given UC precautions should his toe get worse before his appointment and he verbalized understanding.

## 2020-04-11 NOTE — Telephone Encounter (Signed)
Noted  

## 2020-04-14 ENCOUNTER — Ambulatory Visit: Payer: Self-pay | Admitting: Family Medicine

## 2020-04-30 ENCOUNTER — Telehealth (INDEPENDENT_AMBULATORY_CARE_PROVIDER_SITE_OTHER): Payer: Self-pay | Admitting: Family Medicine

## 2020-04-30 ENCOUNTER — Encounter: Payer: Self-pay | Admitting: Family Medicine

## 2020-04-30 DIAGNOSIS — K30 Functional dyspepsia: Secondary | ICD-10-CM

## 2020-04-30 DIAGNOSIS — R059 Cough, unspecified: Secondary | ICD-10-CM

## 2020-04-30 DIAGNOSIS — B360 Pityriasis versicolor: Secondary | ICD-10-CM

## 2020-04-30 MED ORDER — SELENIUM SULFIDE 2.5 % EX LOTN: 1.0000 "application " | TOPICAL_LOTION | Freq: Every day | CUTANEOUS | 12 refills | Status: DC | PRN

## 2020-04-30 NOTE — Progress Notes (Signed)
Virtual Visit via Video Note  I connected with Jonathan Kelly on 04/30/20 at  2:15 PM EDT by a video enabled telemedicine application and verified that I am speaking with the correct person using two identifiers.  Location: Patient: In his home Provider: [facility] Persons participating in virtual visit: Patient, provider   I discussed the limitations of evaluation and management by telemedicine and the availability of in person appointments. The patient expressed understanding and agreed to proceed.  History of Present Illness: Chief Complaint  Patient presents with  . Allergies    mucous buildup in throat x 2 months post eating/being cold    This is a 29 yo male who presents today with above cc.  This is a 29 year old male who reports having a cough with mucus at the base of his throat for about 2 to 2-1/2 months.  He denies nasal drainage, postnasal drainage, sore throat, ear pain, shortness of breath, wheeze.  He smokes 1/2 pack/day down from 1 pack/day.  He reports that his sputum is thick and clear and cough occurs randomly throughout the day lasting 1 to 2 minutes.  It rarely wakes him at night he has also had some heartburn, increased sensation of acid in his chest with spicy foods.  He occasionally takes Tums with some improvement.  He does have a history of seasonal allergies and takes Zyrtec most days with improvement. He requests refill selenium shampoo for his tinea versicolor.   Observations/Objective: Patient is alert and answers questions appropriately, visible skin is unremarkable.  Respirations are even and unlabored without increased work of breathing with conversation, audible wheeze or witnessed cough.  Mood and affect are appropriate. There were no vitals taken for this visit. Wt Readings from Last 3 Encounters:  01/24/20 272 lb (123.4 kg)  06/22/19 282 lb 3 oz (128 kg)  04/17/19 280 lb 8 oz (127.2 kg)   BP Readings from Last 3 Encounters:  01/24/20 124/70   06/22/19 128/84  04/17/19 (!) 142/102    Assessment and Plan: 1. Cough -Discussed adding fluticasone nasal spray, increasing fluids, adding guaifenesin if needed -Discussed controlling acid indigestion as this may be a contributing factor -Encouraged his efforts of decreasing his smoking - cetirizine (ZYRTEC) 10 MG tablet; Take 10 mg by mouth as needed for allergies. -Follow-up if worsening, no improvement or any symptoms of infection including increased purulent sputum, fever/chills, shortness of breath, wheeze  2. Acid indigestion -Discussed using over-the-counter famotidine, avoiding triggers  3. Tinea versicolor - selenium sulfide (SELSUN) 2.5 % shampoo; Apply 1 application topically daily as needed for irritation.  Dispense: 118 mL; Refill: 12   Olean Ree, FNP-BC  [facility], [facility]  05/02/2020 9:50 AM   Follow Up Instructions:    I discussed the assessment and treatment plan with the patient. The patient was provided an opportunity to ask questions and all were answered. The patient agreed with the plan and demonstrated an understanding of the instructions.   The patient was advised to call back or seek an in-person evaluation if the symptoms worsen or if the condition fails to improve as anticipated.     Jonathan Belfast, FNP

## 2020-07-12 ENCOUNTER — Encounter: Payer: Self-pay | Admitting: Family Medicine

## 2020-12-26 ENCOUNTER — Ambulatory Visit: Payer: Self-pay | Admitting: Family Medicine

## 2020-12-29 ENCOUNTER — Ambulatory Visit: Payer: Self-pay | Admitting: Family Medicine

## 2020-12-29 DIAGNOSIS — Z0289 Encounter for other administrative examinations: Secondary | ICD-10-CM

## 2021-01-16 ENCOUNTER — Ambulatory Visit (INDEPENDENT_AMBULATORY_CARE_PROVIDER_SITE_OTHER): Payer: Self-pay | Admitting: Family Medicine

## 2021-01-16 ENCOUNTER — Encounter: Payer: Self-pay | Admitting: Family Medicine

## 2021-01-16 ENCOUNTER — Other Ambulatory Visit: Payer: Self-pay

## 2021-01-16 VITALS — BP 130/82 | HR 90 | Temp 97.2°F | Ht 70.0 in | Wt 279.0 lb

## 2021-01-16 DIAGNOSIS — Z720 Tobacco use: Secondary | ICD-10-CM

## 2021-01-16 DIAGNOSIS — B360 Pityriasis versicolor: Secondary | ICD-10-CM

## 2021-01-16 DIAGNOSIS — R5383 Other fatigue: Secondary | ICD-10-CM

## 2021-01-16 DIAGNOSIS — R059 Cough, unspecified: Secondary | ICD-10-CM

## 2021-01-16 DIAGNOSIS — R7989 Other specified abnormal findings of blood chemistry: Secondary | ICD-10-CM

## 2021-01-16 LAB — CBC WITH DIFFERENTIAL/PLATELET
Basophils Absolute: 0.1 10*3/uL (ref 0.0–0.1)
Basophils Relative: 0.6 % (ref 0.0–3.0)
Eosinophils Absolute: 0.3 10*3/uL (ref 0.0–0.7)
Eosinophils Relative: 3 % (ref 0.0–5.0)
HCT: 46.4 % (ref 39.0–52.0)
Hemoglobin: 15.5 g/dL (ref 13.0–17.0)
Lymphocytes Relative: 32.6 % (ref 12.0–46.0)
Lymphs Abs: 3.8 10*3/uL (ref 0.7–4.0)
MCHC: 33.4 g/dL (ref 30.0–36.0)
MCV: 87 fl (ref 78.0–100.0)
Monocytes Absolute: 0.8 10*3/uL (ref 0.1–1.0)
Monocytes Relative: 6.9 % (ref 3.0–12.0)
Neutro Abs: 6.6 10*3/uL (ref 1.4–7.7)
Neutrophils Relative %: 56.9 % (ref 43.0–77.0)
Platelets: 337 10*3/uL (ref 150.0–400.0)
RBC: 5.33 Mil/uL (ref 4.22–5.81)
RDW: 13.2 % (ref 11.5–15.5)
WBC: 11.6 10*3/uL — ABNORMAL HIGH (ref 4.0–10.5)

## 2021-01-16 LAB — COMPREHENSIVE METABOLIC PANEL
ALT: 62 U/L — ABNORMAL HIGH (ref 0–53)
AST: 25 U/L (ref 0–37)
Albumin: 4.8 g/dL (ref 3.5–5.2)
Alkaline Phosphatase: 83 U/L (ref 39–117)
BUN: 10 mg/dL (ref 6–23)
CO2: 29 mEq/L (ref 19–32)
Calcium: 10 mg/dL (ref 8.4–10.5)
Chloride: 101 mEq/L (ref 96–112)
Creatinine, Ser: 0.9 mg/dL (ref 0.40–1.50)
GFR: 116.62 mL/min (ref >60.00)
Glucose, Bld: 80 mg/dL (ref 70–99)
Potassium: 4 mEq/L (ref 3.5–5.1)
Sodium: 140 mEq/L (ref 135–145)
Total Bilirubin: 0.5 mg/dL (ref 0.2–1.2)
Total Protein: 7.4 g/dL (ref 6.0–8.3)

## 2021-01-16 LAB — TSH: TSH: 1.31 u[IU]/mL (ref 0.35–5.50)

## 2021-01-16 MED ORDER — SELENIUM SULFIDE 2.5 % EX LOTN: 1.0000 "application " | TOPICAL_LOTION | Freq: Every day | CUTANEOUS | 12 refills | Status: DC | PRN

## 2021-01-16 MED ORDER — ALBUTEROL SULFATE HFA 108 (90 BASE) MCG/ACT IN AERS: 1.0000 | INHALATION_SPRAY | Freq: Four times a day (QID) | RESPIRATORY_TRACT | 2 refills | Status: DC | PRN

## 2021-01-16 NOTE — Progress Notes (Signed)
This visit occurred during the SARS-CoV-2 public health emergency.  Safety protocols were in place, including screening questions prior to the visit, additional usage of staff PPE, and extensive cleaning of exam room while observing appropriate contact time as indicated for disinfecting solutions.  duration of symptoms: noted in the last 2 weeks, fatigue. More energy today.  "Today I'm fine." re: energy level.    Hacking cough predates the fatigue.   rhinorrhea: some congestion: yes ear pain: no sore throat: no cough: yes, with some sputum.  Sputum is usually clear.   myalgias: occ noted when getting up from a nap, "like I need to stretch."   Fevers: no Normal taste and smell.   No wheeze.   Cough isn't better with allergy meds.    Recheck pulse 90 with relaxing at the OV.    D/w pt about smoking cessation.    H/o fatty liver with recheck labs pending.  D/w pt about diet and exercise.   Cough better out of the house.  4 cats total.  Father smokes in the house.  Has carpet in the house, with the plan to remove carpet.  Father moved in with patient about 9 months ago.    GERD better cutting out soda.    Needed refill on selenium.  Used prn.    Per HPI unless specifically indicated in ROS section   Meds, vitals, and allergies reviewed.   GEN: nad, alert and oriented HEENT: mucous membranes moist, TM w/o erythema, nasal epithelium injected, OP with cobblestoning NECK: supple w/o LA CV: rrr. PULM: ctab, no inc wob ABD: soft, +bs EXT: no edema Skin with tinea versicolor changes noted on the back and right medial upper arm.

## 2021-01-16 NOTE — Patient Instructions (Signed)
Go to the lab on the way out.   If you have mychart we'll likely use that to update you.    Take care.  Glad to see you. Use inhaler if needed for cough.  Work on quitting smoking.  Keep working on diet and exercise.  Use the selenium if needed.

## 2021-01-18 DIAGNOSIS — R059 Cough, unspecified: Secondary | ICD-10-CM | POA: Insufficient documentation

## 2021-01-18 DIAGNOSIS — R5383 Other fatigue: Secondary | ICD-10-CM | POA: Insufficient documentation

## 2021-01-18 NOTE — Assessment & Plan Note (Signed)
Continue topical selenium as needed and update me as needed.

## 2021-01-18 NOTE — Assessment & Plan Note (Signed)
Encouraged cessation.  Discussed.

## 2021-01-18 NOTE — Assessment & Plan Note (Signed)
History of fatty liver.  Recheck labs pending.  See notes on labs.

## 2021-01-18 NOTE — Assessment & Plan Note (Signed)
Lungs are clear.  Discussed smoking cessation.  He can use albuterol if needed.  Routine albuterol cautions given to patient.  He is going to try to get carpet removed to the house.  He will update me as needed.  Okay for outpatient follow-up.

## 2021-01-18 NOTE — Assessment & Plan Note (Signed)
Unclear source.  See notes on labs.  Improved today.

## 2021-04-14 ENCOUNTER — Telehealth (INDEPENDENT_AMBULATORY_CARE_PROVIDER_SITE_OTHER): Payer: Self-pay | Admitting: Family Medicine

## 2021-04-14 ENCOUNTER — Other Ambulatory Visit: Payer: Self-pay

## 2021-04-14 DIAGNOSIS — H00011 Hordeolum externum right upper eyelid: Secondary | ICD-10-CM | POA: Insufficient documentation

## 2021-04-14 MED ORDER — ERYTHROMYCIN 5 MG/GM OP OINT: 1.0000 "application " | TOPICAL_OINTMENT | Freq: Every day | OPHTHALMIC | 0 refills | Status: AC

## 2021-04-14 NOTE — Assessment & Plan Note (Signed)
Treat with warm compresses, topical erythromycin. Follow up for in person exam if not improving.

## 2021-04-14 NOTE — Progress Notes (Signed)
VIRTUAL VISIT Due to national recommendations of social distancing due to COVID 19, a virtual visit is felt to be most appropriate for this patient at this time.   I connected with the patient on 04/14/21 at 12:00 PM EDT by virtual telehealth platform and verified that I am speaking with the correct person using two identifiers.   I discussed the limitations, risks, security and privacy concerns of performing an evaluation and management service by  virtual telehealth platform and the availability of in person appointments. I also discussed with the patient that there may be a patient responsible charge related to this service. The patient expressed understanding and agreed to proceed.  Patient location: Home Provider Location: [facility] Participants: Kerby Nora and Kandice Hams   Chief Complaint  Patient presents with   Eye Problem    Noticed it this morning 04/14/21-Swelling in the right eye lid, pain, some blurred vision at first when he woke up and rubbed his eyes.. No fever.    History of Present Illness: He reports new onset  this AM, woke up with  right upper eyelid redness and mild swelling.  Sore to touch or blink.   No redness in conjunctiva.  No runny nose, no fever, occ allergy symptoms.   No new meds, no new foods, no exposure.   He started warm compresses.   COVID 19 screen No recent travel or known exposure to COVID19 The patient denies respiratory symptoms of COVID 19 at this time.  The importance of social distancing was discussed today.   Review of Systems  Constitutional:  Negative for chills and fever.  HENT:  Negative for congestion and ear pain.   Eyes:  Negative for pain and redness.  Respiratory:  Negative for cough and shortness of breath.   Cardiovascular:  Negative for chest pain, palpitations and leg swelling.  Gastrointestinal:  Negative for abdominal pain, blood in stool, constipation, diarrhea, nausea and vomiting.  Genitourinary:   Negative for dysuria.  Musculoskeletal:  Negative for falls and myalgias.  Skin:  Negative for rash.  Neurological:  Negative for dizziness.  Psychiatric/Behavioral:  Negative for depression. The patient is not nervous/anxious.      No past medical history on file.  reports that he has been smoking cigarettes. He has a 3.00 pack-year smoking history. He has never used smokeless tobacco. He reports that he does not drink alcohol and does not use drugs.   Current Outpatient Medications:    selenium sulfide (SELSUN) 2.5 % shampoo, Apply 1 application topically daily as needed for irritation., Disp: 118 mL, Rfl: 12   albuterol (VENTOLIN HFA) 108 (90 Base) MCG/ACT inhaler, Inhale 1-2 puffs into the lungs every 6 (six) hours as needed. For cough (Patient not taking: Reported on 04/14/2021), Disp: 8 g, Rfl: 2   Observations/Objective: There were no vitals taken for this visit.  Physical Exam HENT:     Head: Normocephalic.     Nose: Nose normal.  Eyes:     Extraocular Movements: Extraocular movements intact.     Right eye: Normal extraocular motion.     Left eye: Normal extraocular motion.     Conjunctiva/sclera: Conjunctivae normal.     Right eye: Right conjunctiva is not injected.     Left eye: Left conjunctiva is not injected.      Comments: Area of swelling and redness     Assessment and Plan Problem List Items Addressed This Visit     Hordeolum externum of right upper eyelid -  Primary    Treat with warm compresses, topical erythromycin. Follow up for in person exam if not improving.      Meds ordered this encounter  Medications   erythromycin ophthalmic ointment    Sig: Place 1 application into the right eye at bedtime for 4 days.    Dispense:  3.5 g    Refill:  0      I discussed the assessment and treatment plan with the patient. The patient was provided an opportunity to ask questions and all were answered. The patient agreed with the plan and demonstrated an  understanding of the instructions.   The patient was advised to call back or seek an in-person evaluation if the symptoms worsen or if the condition fails to improve as anticipated.     Kerby Nora, MD

## 2021-04-24 ENCOUNTER — Ambulatory Visit: Payer: Self-pay | Admitting: Family Medicine

## 2021-06-22 ENCOUNTER — Telehealth: Payer: Self-pay | Admitting: *Deleted

## 2021-06-22 NOTE — Telephone Encounter (Addendum)
Spoke to patient by telephone and was advised that he started with symptoms about 4 days ago. Patient stated that he went to [facility] yesterday. Patient stated that he was tested for the flu and covid which came back negative. Patient stated that he feels like he has had the flu because he was find and then suddenly got sick. Patient stated that his dad had the same thing last week and he had to take care of  him. Patient stated that he had a fever, headache, body aches, sweats and diarrhea. Patient stated that he was given anti-diarrhea medication which has really helped. Patient stated that he is feeling better today. Patient stated that he is able to tolerate liquids and food today. Patient was advised to rest, drink fluids and try to eat well-balanced meals.  Patient was given ER precautions and he verbalized understanding. Patient was advised to call the office back if he does not continue to improve.

## 2021-06-22 NOTE — Telephone Encounter (Signed)
Noted. Agree with these recommendations.

## 2021-06-22 NOTE — Telephone Encounter (Signed)
Agree. Thanks

## 2021-06-22 NOTE — Telephone Encounter (Signed)
PLEASE NOTE: All timestamps contained within this report are represented as [place] Standard Time. CONFIDENTIALTY NOTICE: This fax transmission is intended only for the addressee. It contains information that is legally privileged, confidential or otherwise protected from use or disclosure. If you are not the intended recipient, you are strictly prohibited from reviewing, disclosing, copying using or disseminating any of this information or taking any action in reliance on or regarding this information. If you have received this fax in error, please notify us immediately by telephone so that we can arrange for its return to [place]. Phone: [phone], Toll-Free: [phone], Fax: [phone] Page: 1 of 2 Call Id: [id number] [facility] Night - Client TELEPHONE ADVICE RECORD AccessNurse Patient Name: Jonathan Kelly [facility] Gender: Male DOB: 01-23-1991 Age: 30 Y 5 M 12 D Return Phone Number: [phone] (Primary) Address: City/ State/ Zip: [place] [address] Client [facility] Night - Client Client Site [facility]  - Night Provider Raechel Ache - MD Contact Type Call Who Is Calling Patient / Member / Family / Caregiver Call Type Triage / Clinical Relationship To Patient Self Return Phone Number [phone] (Primary) Chief Complaint Diarrhea Reason for Call Symptomatic / Request for Health Information Initial Comment Caller states he has been sick for 3 days with diarrhea. He is sweating alot also. Translation No Nurse Assessment Nurse: Danae Orleans, RN, Cristal Deer Date/Time Lamount Cohen Time): 06/21/2021 10:26:04 AM Confirm and document reason for call. If symptomatic, describe symptoms. ---Caller states since thursday body aches, fatigue, and feeling unwell. States diarrhea x3 days; states cough w/ mucus. No fever. Does the patient have any new or worsening symptoms? ---Yes Will a triage be completed? ---Yes Related visit  to physician within the last 2 weeks? ---N/A Does the PT have any chronic conditions? (i.e. diabetes, asthma, this includes High risk factors for pregnancy, etc.) ---No Is this a behavioral health or substance abuse call? ---No Guidelines Guideline Title Affirmed Question Affirmed Notes Nurse Date/Time (Eastern Time) Diarrhea [1] SEVERE diarrhea (e.g., 7 or more times / day more than normal) AND [2] present > 24 hours (1 day) Danae Orleans, RN, Cristal Deer 06/21/2021 10:27:56 AM Disp. Time Lamount Cohen Time) Disposition Final User 06/21/2021 10:31:49 AM See PCP within 24 Hours Yes Bush, RN, Cristal Deer PLEASE NOTE: All timestamps contained within this report are represented as [place] Standard Time. CONFIDENTIALTY NOTICE: This fax transmission is intended only for the addressee. It contains information that is legally privileged, confidential or otherwise protected from use or disclosure. If you are not the intended recipient, you are strictly prohibited from reviewing, disclosing, copying using or disseminating any of this information or taking any action in reliance on or regarding this information. If you have received this fax in error, please notify us immediately by telephone so that we can arrange for its return to [place]. Phone: [phone], Toll-Free: [phone], Fax: [phone] Page: 2 of 2 Call Id: [id number] Caller Disagree/Comply Comply Caller Understands Yes PreDisposition Did not know what to do Care Advice Given Per Guideline CARE ADVICE given per Diarrhea (Adult) guideline. * You become worse * Constant or severe abdomen pain * Bloody stools * Signs of dehydration occur (e.g., no urine over 12 hours, very dry mouth, lightheaded, etc.) CALL BACK IF: SEE PCP WITHIN 24 HOURS: * IF OFFICE WILL BE OPEN: You need to be examined within the next 24 hours. Call your doctor (or NP/PA) when the office opens and make an appointment. * IF OFFICE WILL BE CLOSED: You need to be  seen within  the next 24 hours. A clinic or an urgent care center is often a good source of care if your doctor's office is closed or you can't get an appointment. * IF PATIENT HAS NO PCP: Refer patient to a clinic or urgent care center. Also try to help caller find a PCP for future care. Referrals REFERRED TO PCP OFFICE

## 2021-09-17 ENCOUNTER — Encounter: Payer: Self-pay | Admitting: Family Medicine

## 2021-10-05 ENCOUNTER — Ambulatory Visit: Payer: Self-pay | Admitting: Family Medicine

## 2021-11-27 ENCOUNTER — Ambulatory Visit: Payer: Self-pay | Admitting: Family Medicine

## 2021-12-11 ENCOUNTER — Encounter: Payer: Self-pay | Admitting: Family Medicine

## 2021-12-11 ENCOUNTER — Ambulatory Visit (INDEPENDENT_AMBULATORY_CARE_PROVIDER_SITE_OTHER): Payer: Self-pay | Admitting: Family Medicine

## 2021-12-11 DIAGNOSIS — R000 Tachycardia, unspecified: Secondary | ICD-10-CM

## 2021-12-11 DIAGNOSIS — K0381 Cracked tooth: Secondary | ICD-10-CM

## 2021-12-11 DIAGNOSIS — L989 Disorder of the skin and subcutaneous tissue, unspecified: Secondary | ICD-10-CM

## 2021-12-11 DIAGNOSIS — B360 Pityriasis versicolor: Secondary | ICD-10-CM

## 2021-12-11 DIAGNOSIS — F17200 Nicotine dependence, unspecified, uncomplicated: Secondary | ICD-10-CM

## 2021-12-11 MED ORDER — ITRACONAZOLE 100 MG PO CAPS: 200.0000 mg | ORAL_CAPSULE | Freq: Every day | ORAL | 0 refills | Status: AC

## 2021-12-11 MED ORDER — AMOXICILLIN 875 MG PO TABS: 875.0000 mg | ORAL_TABLET | Freq: Two times a day (BID) | ORAL | 0 refills | Status: DC

## 2021-12-11 NOTE — Patient Instructions (Addendum)
Don't pick at the skin spot.  If it will not heal over the let me know.  Call the dental clinic about follow up and start amoxil.   Start itraconazole when done with amoxil.   Take care.  Glad to see you. If your pulse stays above 100 then let me know.

## 2021-12-11 NOTE — Progress Notes (Signed)
Smoking and pulse d/w pt.  Recheck pulse 101, then 93.  Smoking 1.5-2 PPD.  Stressors d/w pt, ie relationship with his father.  He is living with his father in the meantime.  He is out of work in the meantime.  He was prev doing IT work, Consulting civil engineer work.   Skin rash clearly improves/resolves with clotrimazole.  On the back, buttock.  The rash can change color with sun exposure.  No FCNAVD.  He has had recurrences.    Cracked tooth with dental clinic f/u pending.   Irritated skin lesion L shin, 1cm across.  He had been picking at the area.    Meds, vitals, and allergies reviewed.   ROS: Per HPI unless specifically indicated in ROS section   Nad Ncat MMM Cracked R lower molar with irritated gumline locally.   Neck supple, no lymphadenopathy. Rrr Ctab Abd soft,  L shin 1cm irritated macule w/o ulceration.  Tinea vericolor on the buttocks B and upper back.

## 2021-12-13 DIAGNOSIS — K0381 Cracked tooth: Secondary | ICD-10-CM | POA: Insufficient documentation

## 2021-12-13 DIAGNOSIS — R000 Tachycardia, unspecified: Secondary | ICD-10-CM | POA: Insufficient documentation

## 2021-12-13 DIAGNOSIS — F17200 Nicotine dependence, unspecified, uncomplicated: Secondary | ICD-10-CM | POA: Insufficient documentation

## 2021-12-13 DIAGNOSIS — L989 Disorder of the skin and subcutaneous tissue, unspecified: Secondary | ICD-10-CM | POA: Insufficient documentation

## 2021-12-13 NOTE — Assessment & Plan Note (Signed)
Start itraconazole when done with Amoxil.  Routine cautions given to patient.

## 2021-12-13 NOTE — Assessment & Plan Note (Signed)
This is benign appearing but if it does not heal over he can update me.  I asked him not to pick at it. L shin 1cm irritated macule w/o ulceration.

## 2021-12-13 NOTE — Assessment & Plan Note (Signed)
I asked him to call the dental clinic about follow up and start amoxil.

## 2021-12-13 NOTE — Assessment & Plan Note (Signed)
Resolved in clinic.  If his pulse stays above 100 then I want him to let me know.  He can check it outside of clinic.

## 2021-12-13 NOTE — Assessment & Plan Note (Signed)
Cessation encouraged 

## 2021-12-14 ENCOUNTER — Encounter: Payer: Self-pay | Admitting: Family Medicine

## 2021-12-31 ENCOUNTER — Telehealth: Payer: Self-pay

## 2021-12-31 NOTE — Telephone Encounter (Signed)
Okay to given him a work note but needs OV when possible re: recent sx.  Would hold itraconazole in the meantime.  Thanks.

## 2021-12-31 NOTE — Telephone Encounter (Signed)
Spoke with pt relaying Dr. Lianne Bushy message.  Pt verbalizes understanding and scheduled OV on 01/07/22 at 4:00.  I notified pt I will send a work note via Clinical cytogeneticist. He expresses his thanks.

## 2021-12-31 NOTE — Telephone Encounter (Addendum)
I spoke with pt and he verified his contact # is [phone] but that pt is terrible about answering or checking his phone. I asked pt to please monitor phone today so after Dr Para March sees this note pt can be notified of further instructions from Dr Para March. Pt was seen 12/11/21 and finished Amoxil about one wk ago. Has appt coming up soon to see dentist; pt did not start Itraconazole 200 mg until today 12/31/21 at 7 AM.(Reason did not start Itraconazole when finished Amoxil as instructed pt said he hates taking pills). About 9:30 AM pt began with lightheadedness for 10 - 15 mins this morning on and off.pt has never taken this med before. Pt said he took both itraconazole 100 mg tabs at same time, pt was afraid lightheadedness would come back again so pt did not go to work today and will need a note to be out of work for today. 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 Pt's new work schedule will come out later today but pt is supposed to work on 01/01/22.pt said other than the lightheadedness earlier he feels "pretty great". Pt said no fast heartbeat, no H/A, CP or SOB. Pt's next dose of med is on 01/01/22 so pt request cb today to advise what to do about taking the Itraconazole.Sending  note to Dr Para March and Shanda Bumps CMA and will teams Shanda Bumps also. UC & ED precautions given and pt voiced understanding.

## 2021-12-31 NOTE — Telephone Encounter (Signed)
Jonathan Kelly,  Please call and triage.  Itraconazole is not on current medication list.  Not sure who prescribed it for him?

## 2021-12-31 NOTE — Telephone Encounter (Signed)
I called all contact #s and spoke with  Lurena Joiner (DPR not signed but she said that pt no longer has a phone and to call Renae Fickle on cell. Left v/m on pauls cell to ask either he or Ridwan to call [facility] for additional info. Sending note to lsc  triage.

## 2021-12-31 NOTE — Telephone Encounter (Signed)
Patient is calling in stating that he was prescribed itraconazole yesterday. When he took it this morning he noticed he got really dizzy, states that it was slowly going away while on the phone but is going to be staying home from work. Asked if he can get a work note and also wants to know what if he should take off from work while taking it.

## 2022-01-07 ENCOUNTER — Encounter: Payer: Self-pay | Admitting: Family Medicine

## 2022-01-07 ENCOUNTER — Telehealth (INDEPENDENT_AMBULATORY_CARE_PROVIDER_SITE_OTHER): Payer: Self-pay | Admitting: Family Medicine

## 2022-01-07 DIAGNOSIS — L989 Disorder of the skin and subcutaneous tissue, unspecified: Secondary | ICD-10-CM

## 2022-01-07 DIAGNOSIS — R059 Cough, unspecified: Secondary | ICD-10-CM

## 2022-01-07 DIAGNOSIS — K0381 Cracked tooth: Secondary | ICD-10-CM

## 2022-01-07 DIAGNOSIS — B360 Pityriasis versicolor: Secondary | ICD-10-CM

## 2022-01-07 MED ORDER — SELENIUM SULFIDE 2.5 % EX LOTN: 1.0000 | TOPICAL_LOTION | Freq: Every day | CUTANEOUS | 5 refills | Status: AC | PRN

## 2022-01-07 MED ORDER — ALBUTEROL SULFATE HFA 108 (90 BASE) MCG/ACT IN AERS: 1.0000 | INHALATION_SPRAY | Freq: Four times a day (QID) | RESPIRATORY_TRACT | 2 refills | Status: AC | PRN

## 2022-01-07 MED ORDER — AMOXICILLIN 875 MG PO TABS: 875.0000 mg | ORAL_TABLET | Freq: Two times a day (BID) | ORAL | 0 refills | Status: AC

## 2022-01-07 NOTE — Telephone Encounter (Signed)
Called patient back and advised this should really be in person. Patient states he has been feeling much better since he stopped the medication and its a problem getting here. He can not get off work in time to make it to the actual in person appt. Do you want to do VV or try to reschedule when he can?

## 2022-01-07 NOTE — Telephone Encounter (Signed)
VV if needed.  Thanks.

## 2022-01-07 NOTE — Telephone Encounter (Signed)
Called patient and changed appt to VV at 4:00 pm.

## 2022-01-07 NOTE — Telephone Encounter (Signed)
Would prefer in person if at all possible.  Thanks.

## 2022-01-07 NOTE — Progress Notes (Signed)
Virtual visit completed through WebEx or similar program Patient location: home  Provider location: [place] at [facility], office  Participants: Patient and me (unless stated otherwise below)  Pandemic considerations d/w pt.   Limitations and rationale for visit method d/w patient.  Patient agreed to proceed.   CC: follow up  HPI: needed refill on albuterol, done at [facility].  Not used daily.  Cough resolved with humidifier and air purifier.    Pulse has been 80-90 usually, d/w pt.    H/o cracked tooth, he had dental f/u pending.  Pain resolved.  Done with abx.  D/w pt about having amoxil on hand in case he had return of pain.    He had "slight dizziness" with sporonox use after one dose.  No sensation of motion with med use.  No sx after med cessation.  Rash is still present; he tried using selenium in the meantime.  No focal neuro changes, no speech or vision changes with med use or o/w.  No syncope.    He stopped picking at lesion on the L shin.  It is still visible, no change per patient report.  He is going to take a photo and send me the image.  We'll go from there.    Meds and allergies reviewed.   ROS: Per HPI unless specifically indicated in ROS section   NAD Speech wnl  A/P: Tinea versicolor. Stop systemic tx and change back to selenium.   He will update me as needed.  He will update me about the skin lesion.  He has dental clinic follow-up pending.  Cough resolved in the meantime.

## 2022-01-07 NOTE — Telephone Encounter (Signed)
Patient called and wanted to know if this appointment could be a virtual visit. Please advise

## 2022-01-10 NOTE — Assessment & Plan Note (Signed)
Resolved with humidifier and air purifier.  Albuterol prescription sent to use if needed.

## 2022-01-10 NOTE — Assessment & Plan Note (Signed)
Per dental clinic.

## 2022-01-10 NOTE — Assessment & Plan Note (Signed)
Stop systemic tx and change back to selenium.   He will update me as needed.

## 2022-01-10 NOTE — Assessment & Plan Note (Signed)
He will send me an updated picture in the meantime.

## 2022-01-18 ENCOUNTER — Other Ambulatory Visit: Payer: Self-pay | Admitting: Family Medicine

## 2022-01-18 DIAGNOSIS — L989 Disorder of the skin and subcutaneous tissue, unspecified: Secondary | ICD-10-CM

## 2022-06-21 ENCOUNTER — Ambulatory Visit: Payer: Self-pay | Admitting: Dermatology
